# Patient Record
Sex: Male | Born: 1935 | Race: White | Hispanic: No | Marital: Married | Smoking: Former smoker
Health system: Southern US, Community
[De-identification: ages and names within clinical notes are randomized; demographics above are authoritative.]

## PROBLEM LIST (undated history)

## (undated) ENCOUNTER — Emergency Department (HOSPITAL_COMMUNITY): Discharge status: Against Medical Advice | Payer: Medicare Other

## (undated) DIAGNOSIS — E119 Type 2 diabetes mellitus without complications: Secondary | ICD-10-CM

## (undated) DIAGNOSIS — K469 Unspecified abdominal hernia without obstruction or gangrene: Secondary | ICD-10-CM

## (undated) DIAGNOSIS — N400 Enlarged prostate without lower urinary tract symptoms: Secondary | ICD-10-CM

---

## 2005-12-02 ENCOUNTER — Ambulatory Visit: Payer: Self-pay | Admitting: Family Medicine

## 2006-06-06 ENCOUNTER — Telehealth (INDEPENDENT_AMBULATORY_CARE_PROVIDER_SITE_OTHER): Payer: Self-pay | Admitting: *Deleted

## 2006-06-08 ENCOUNTER — Telehealth: Payer: Self-pay | Admitting: *Deleted

## 2006-06-08 ENCOUNTER — Telehealth (INDEPENDENT_AMBULATORY_CARE_PROVIDER_SITE_OTHER): Payer: Self-pay | Admitting: *Deleted

## 2006-06-10 ENCOUNTER — Ambulatory Visit: Payer: Self-pay | Admitting: Family Medicine

## 2006-06-10 DIAGNOSIS — E119 Type 2 diabetes mellitus without complications: Secondary | ICD-10-CM | POA: Insufficient documentation

## 2006-06-10 DIAGNOSIS — N400 Enlarged prostate without lower urinary tract symptoms: Secondary | ICD-10-CM

## 2006-06-10 LAB — CONVERTED CEMR LAB
Glucose, Urine, Semiquant: 100
Urobilinogen, UA: 0.2
WBC, UA: >20 cells/hpf
pH: 6

## 2006-11-24 ENCOUNTER — Encounter: Payer: Self-pay | Admitting: Family Medicine

## 2007-05-18 ENCOUNTER — Ambulatory Visit: Payer: Self-pay | Admitting: Family Medicine

## 2007-05-18 DIAGNOSIS — K4090 Unilateral inguinal hernia, without obstruction or gangrene, not specified as recurrent: Secondary | ICD-10-CM | POA: Insufficient documentation

## 2007-05-18 LAB — CONVERTED CEMR LAB
AST: 22 units/L (ref 0–37)
Albumin: 4.5 g/dL (ref 3.5–5.2)
BUN: 13 mg/dL (ref 6–23)
Calcium: 9.5 mg/dL (ref 8.4–10.5)
Chloride: 107 meq/L (ref 96–112)
Glucose, Bld: 138 mg/dL — ABNORMAL HIGH (ref 70–99)
Potassium: 4.8 meq/L (ref 3.5–5.3)

## 2007-05-19 ENCOUNTER — Encounter: Payer: Self-pay | Admitting: Family Medicine

## 2007-05-19 LAB — CONVERTED CEMR LAB: Hgb A1c MFr Bld: 6.7 %

## 2007-05-26 ENCOUNTER — Telehealth: Payer: Self-pay | Admitting: *Deleted

## 2008-01-11 ENCOUNTER — Telehealth (INDEPENDENT_AMBULATORY_CARE_PROVIDER_SITE_OTHER): Payer: Self-pay | Admitting: *Deleted

## 2008-01-15 ENCOUNTER — Telehealth (INDEPENDENT_AMBULATORY_CARE_PROVIDER_SITE_OTHER): Payer: Self-pay | Admitting: *Deleted

## 2008-01-18 ENCOUNTER — Ambulatory Visit: Payer: Self-pay | Admitting: Family Medicine

## 2008-01-18 LAB — CONVERTED CEMR LAB: Hgb A1c MFr Bld: 6.6 %

## 2008-10-31 ENCOUNTER — Ambulatory Visit: Payer: Self-pay | Admitting: Family Medicine

## 2008-10-31 DIAGNOSIS — M1990 Unspecified osteoarthritis, unspecified site: Secondary | ICD-10-CM | POA: Insufficient documentation

## 2009-02-25 ENCOUNTER — Telehealth: Payer: Self-pay | Admitting: Family Medicine

## 2009-08-11 ENCOUNTER — Ambulatory Visit: Payer: Self-pay | Admitting: Family Medicine

## 2009-08-11 LAB — CONVERTED CEMR LAB
Alkaline Phosphatase: 60 units/L (ref 39–117)
Creatinine, Ser: 1 mg/dL (ref 0.40–1.50)
Glucose, Bld: 144 mg/dL — ABNORMAL HIGH (ref 70–99)
HCT: 41.7 % (ref 39.0–52.0)
MCHC: 31.9 g/dL (ref 30.0–36.0)
MCV: 88.3 fL (ref 78.0–100.0)
RBC: 4.72 M/uL (ref 4.22–5.81)
Sodium: 138 meq/L (ref 135–145)
Total Bilirubin: 0.5 mg/dL (ref 0.3–1.2)
Total Protein: 6.6 g/dL (ref 6.0–8.3)

## 2009-08-12 ENCOUNTER — Encounter: Payer: Self-pay | Admitting: Family Medicine

## 2009-08-29 ENCOUNTER — Encounter: Payer: Self-pay | Admitting: Family Medicine

## 2009-10-15 ENCOUNTER — Ambulatory Visit: Payer: Self-pay | Admitting: Family Medicine

## 2009-10-15 DIAGNOSIS — R109 Unspecified abdominal pain: Secondary | ICD-10-CM | POA: Insufficient documentation

## 2009-10-15 LAB — CONVERTED CEMR LAB
ALT: 11 units/L (ref 0–53)
Bilirubin Urine: NEGATIVE
Blood in Urine, dipstick: NEGATIVE
CO2: 25 meq/L (ref 19–32)
Calcium: 9.1 mg/dL (ref 8.4–10.5)
Chloride: 102 meq/L (ref 96–112)
Creatinine, Ser: 1.05 mg/dL (ref 0.40–1.50)
Glucose, Bld: 128 mg/dL — ABNORMAL HIGH (ref 70–99)
HCT: 50.5 % (ref 39.0–52.0)
Lipase: 24 units/L (ref 0–75)
MCV: 107 fL — ABNORMAL HIGH (ref 78.0–100.0)
RBC: 4.72 M/uL (ref 4.22–5.81)
Sodium: 139 meq/L (ref 135–145)
Total Bilirubin: 1 mg/dL (ref 0.3–1.2)
Total Protein: 6.8 g/dL (ref 6.0–8.3)
Urobilinogen, UA: 0.2
WBC: 5.5 10*3/uL (ref 4.0–10.5)

## 2009-10-28 ENCOUNTER — Telehealth: Payer: Self-pay | Admitting: Family Medicine

## 2009-11-05 ENCOUNTER — Ambulatory Visit: Payer: Self-pay | Admitting: Family Medicine

## 2009-11-05 DIAGNOSIS — H547 Unspecified visual loss: Secondary | ICD-10-CM

## 2010-01-28 ENCOUNTER — Encounter: Payer: Self-pay | Admitting: *Deleted

## 2010-04-28 NOTE — Assessment & Plan Note (Signed)
Summary: f/up,tcb   Vital Signs:  Patient profile:   77 year old male Weight:      149.4 pounds Temp:     98.0 degrees F Pulse rate:   82 / minute BP sitting:   119 / 77  (left arm) Cuff size:   regular  Vitals Entered By: Garen Grams LPN (October 15, 2009 8:32 AM) CC: pains in stomach Is Patient Diabetic? Yes Did you bring your meter with you today? No Pain Assessment Patient in pain? yes     Location: stomach   CC:  pains in stomach.  History of Present Illness: ABDOMINAL PAIN Location: both flanks R > L Onset:has a written note detailing started afer he made chicken dish on July 12. Severity:  can be bad when he bends or lies down made better with walking and when he had a bowel movements afer taking MOM.  He was constipated after onset of pain.  His appetite has been decreased also.  The severity of the pain has lessened since yesterday  Symptoms Nausea/Vomiting:N Diarrhea:N Melena/BRBPR:N Hematemesis:N Fever/Chills:N Dysuria:N Back pain:N Rash:N Wt loss:N  ROS - as above PMH - Medications reviewed and updated in medication list.  Smoking Status noted in VS form     Habits & Providers  Alcohol-Tobacco-Diet     Tobacco Status: never  Current Medications (verified): 1)  Terazosin Hcl 5 Mg  Caps (Terazosin Hcl) .Marland Kitchen.. 1at Bedtime. 2)  Accu-Chek Comfort Curve  Strp (Glucose Blood)  Allergies: No Known Drug Allergies  Social History: Smoking Status:  never  Physical Exam  General:  Well-developed,well-nourished,in no acute distress; alert,appropriate and cooperative throughout examination.  His usual joking self with me and nurses- brought in a written joke Neck:  No deformities, masses, or tenderness noted. Lungs:  Normal respiratory effort, chest expands symmetrically. Lungs are clear to auscultation, no crackles or wheezes. Heart:  Normal rate and regular rhythm. S1 and S2 normal without gallop, murmur, click, rub or other extra sounds. Abdomen:   mildly distended with increased bowel sounds.  Mildly tender over R and L UQ and flank areas.  No gaurding or rebound No masses   Femoral pulses equal and strong bilaterally Msk:  Tender over L and R paraspinous muscles and flank area.  Pain worse with sitting up and bending  Extremities:  No clubbing, cyanosis, edema, or deformity noted with normal full range of motion of all joints.   Skin:  No rashes  Cervical Nodes:  No lymphadenopathy noted Inguinal Nodes:  No significant adenopathy   Impression & Recommendations:  Problem # 1:  ABDOMINAL PAIN (ICD-789.00) Seems most consistent with constipation and distension although could be musculoskeletal with pain over flanks. Renal infection very unlikely given normal urinalysis.  No red flags for infection or obstruction or aneurysm.  Will check labs and follow closely.   He had not called his daughter and I strongly suggeste that he should mention it to her.  See instructions.  If worsens or persists or labs are concerning will likely need imaging   Orders: Comp Met-[facility] [phone]) CBC-[facility] (09811) Urinalysis-[facility] (00000) Lipase-[facility] ([id number]) [facility]- Est  Level 4 ([address])  Complete Medication List: 1)  Terazosin Hcl 5 Mg Caps (Terazosin hcl) .Marland Kitchen.. 1at bedtime. 2)  Accu-chek Comfort Curve Strp (Glucose blood)  Patient Instructions: 1)  If the stomach pain is not better in 2 days come back (Friday) 2)  If not gone in one week then come back 3)  If any bleeding or fever or  worsening pain you should calll [place] immediately 4)  If no bowel movement by tomorrow take one pack of Miralax 5)   (over the counter)  6)  I will call you if your lab is abnormal otherwise I will send you a letter within 2 weeks.  Laboratory Results   Urine Tests  Date/Time Received: October 15, 2009 8:50 AM  Date/Time Reported: October 15, 2009 9:26 AM   Routine Urinalysis   Color: yellow Appearance: Clear Glucose: negative   (Normal Range: Negative) Bilirubin:  negative   (Normal Range: Negative) Ketone: small (15)   (Normal Range: Negative) Spec. Gravity: 1.015   (Normal Range: 1.003-1.035) Blood: negative   (Normal Range: Negative) pH: 7.5   (Normal Range: 5.0-8.0) Protein: trace   (Normal Range: Negative) Urobilinogen: 0.2   (Normal Range: 0-1) Nitrite: negative   (Normal Range: Negative) Leukocyte Esterace: trace   (Normal Range: Negative)  Urine Microscopic WBC/HPF: 1-5 RBC/HPF: 0-3 Bacteria/HPF: 1+ Mucous/HPF: 2+ Epithelial/HPF: occ Casts/LPF: 1-5 hyaline    Comments: ...............test performed by......Marland KitchenBonnie A. [place], MLS (ASCP)cm

## 2010-04-28 NOTE — Assessment & Plan Note (Signed)
Summary: f/u,df   Vital Signs:  Patient profile:   77 year old male Weight:      153.6 pounds Temp:     98.7 degrees F oral Pulse rate:   71 / minute BP sitting:   125 / 78  (left arm) Cuff size:   regular  Vitals Entered By: Garen Grams LPN (Aug 11, 2009 3:46 PM) CC: f/u dm Is Patient Diabetic? Yes Did you bring your meter with you today? No Pain Assessment Patient in pain? no        CC:  f/u dm.  History of Present Illness: Feels generally well.  Walking 1 mi per day when not too hot.  DIABETES Disease Monitoring Blood Sugar ranges: checks fasting once a month all less 150   Polyuria:no more than usual three times a day  night time    Visual problems:N  Medications Compliance:diet controlled   Hypoglycemic symptoms:N  Aches some in neck aftering getting hugged.  Better since using heat.  No weakness or loss of range of motion  BPH doing well with terazosin. Urinates 2-3 times per night with fairly normal stream.  No pain or urgency or bleeding   ROS - as above PMH - Medications reviewed and updated in medication list.  Smoking Status noted in VS form       Habits & Providers  Alcohol-Tobacco-Diet     Tobacco Status: quit > 6 months  Current Medications (verified): 1)  Terazosin Hcl 5 Mg  Caps (Terazosin Hcl) .Marland Kitchen.. 1at Bedtime. 2)  Accu-Chek Comfort Curve  Strp (Glucose Blood)  Allergies: No Known Drug Allergies  Physical Exam  General:  Well-developed,well-nourished,in no acute distress; alert,appropriate and cooperative throughout examination,  Hard of hearing but knows all his medications and routines  Head:  Normocephalic and atraumatic without obvious abnormalities. No apparent alopecia or balding. Eyes:  R eye cloudy old injury  Ears:  External ear exam shows no significant lesions or deformities.  Otoscopic examination reveals clear canals, tympanic membranes are intact bilaterally without bulging, retraction, inflammation or discharge.  Hearing is grossly normal bilaterally. Mouth:  Oral mucosa and oropharynx without lesions or exudates.  Teeth in good repair. Neck:  No deformities, masses, or tenderness noted. Lungs:  Normal respiratory effort, chest expands symmetrically. Lungs are clear to auscultation, no crackles or wheezes. Heart:  Normal rate and regular rhythm. S1 and S2 normal without gallop, murmur, click, rub or other extra sounds. Abdomen:  Bowel sounds positive,abdomen soft and non-tender without masses, organomegaly or hernias noted. Genitalia:  Testes bilaterally descended without nodularity, tenderness or masses. No scrotal masses or lesions. No penis lesions or urethral discharge.   Small hernias bilaterally nontender  Msk:  No deformity or scoliosis noted of thoracic or lumbar spine.   Extremities:  No clubbing, cyanosis, edema, or deformity noted with normal full range of motion of all joints.   Small abrasion 2mm on L great toe bandaged and healing well.  Trimmed long toenal in R small toe Skin:  Intact without suspicious lesions or rashes Cervical Nodes:  No lymphadenopathy noted  Diabetes Management Exam:    Foot Exam (with socks and/or shoes not present):       Sensory-Pinprick/Light touch:          Left medial foot (L-4): normal          Left dorsal foot (L-5): normal          Left lateral foot (S-1): normal  Right medial foot (L-4): normal          Right dorsal foot (L-5): normal          Right lateral foot (S-1): normal       Sensory-Monofilament:          Left foot: normal          Right foot: normal       Inspection:          Left foot: normal          Right foot: normal       Nails:          Left foot: normal          Right foot: normal   Impression & Recommendations:  Problem # 1:  DIABETES MELLITUS, TYPE II (ICD-250.00) Good control  Orders: A1C-[facility] ([address]) Comp Met-[facility] (60454-09811) CBC-[facility] (91478) [facility]- Est  Level 4 (29562)  Labs Reviewed: Creat: 1.04 (05/18/2007)     Reviewed HgBA1c results: 6.4 (08/11/2009)  6.6 (10/31/2008)  Problem # 2:  BENIGN PROSTATIC HYPERTROPHY (ICD-600.00)  Well controlled with terazosin   Orders: [facility]- Est  Level 4 (13086)  Problem # 3:  DEGENERATIVE JOINT DISEASE (ICD-715.90)  mild exacerbation in cervical spine without red flags.  Continue his exercise program and using heat.    Orders: [facility]- Est  Level 4 ([address]846)  Complete Medication List: 1)  Terazosin Hcl 5 Mg Caps (Terazosin hcl) .Marland Kitchen.. 1at bedtime. 2)  Accu-chek Comfort Curve Strp (Glucose blood)  Patient Instructions: 1)  Please schedule a follow-up appointment in 1 year.  2)  I will call you if your lab is abnormal otherwise I will send you a letter within 2 weeks. 3)  File your nails every second day to keep them short 4)  Your A1c was 6.4 which means great control 5)  See Dr Hazle Quant for your eye check 6)  Call me with any questions or problem  Laboratory Results   Blood Tests   Date/Time Received: Aug 11, 2009 3:41 PM  Date/Time Reported: Aug 11, 2009 4:06 PM   HGBA1C: 6.4%   (Normal Range: Non-Diabetic - 3-6%   Control Diabetic - 6-8%)  Comments: ...............test performed by.................Marland KitchenGaren Grams, LPN .............entered by...........Marland KitchenBonnie A. [place], MLS (ASCP)cm

## 2010-04-28 NOTE — Consult Note (Signed)
Summary: [facility]   Imported By: Clydell Hakim 09/11/2009 12:05:25  _____________________________________________________________________  External Attachment:    Type:   Image     Comment:   External Document

## 2010-04-28 NOTE — Assessment & Plan Note (Signed)
Summary: abd pain/[place]   Vital Signs:  Patient profile:   77 year old male Weight:      148.8 pounds Temp:     98.4 degrees F oral Pulse rate:   72 / minute Pulse rhythm:   regular BP sitting:   123 / 77  (right arm) Cuff size:   regular  Vitals Entered By: Loralee Pacas CMA (November 05, 2009 9:26 AM) CC: abdominal pain   CC:  abdominal pain.  History of Present Illness: Abdominal Pain steadily improving but still mild biltat anter flank pain when first gets up in the AM.  None throughout the day or when moves around.  Still walking everyday for exercise. bowel movements are regular without blood.  No nausea or vomiting or diarrhea.  Eating well with lots of fruit.  No fevers or chills or fatigue  ROS - as above PMH - Medications reviewed and updated in medication list.  Smoking Status noted in VS form    -  Date:  08/27/2009    Diabetes Eye Exam [place]   Current Medications (verified): 1)  Terazosin Hcl 5 Mg  Caps (Terazosin Hcl) .Marland Kitchen.. 1at Bedtime. 2)  Accu-Chek Comfort Curve  Strp (Glucose Blood)  Allergies: No Known Drug Allergies  Physical Exam  General:  Well-developed,well-nourished,in no acute distress; alert,appropriate and cooperative throughout examination Lungs:  Normal respiratory effort, chest expands symmetrically. Lungs are clear to auscultation, no crackles or wheezes. Heart:  Normal rate and regular rhythm. S1 and S2 normal without gallop, murmur, click, rub or other extra sounds. Abdomen:  Bowel sounds positive,abdomen softr without masses, organomegaly or hernias noted. Mild bilateral mid lateral tenderness with deep palpation no massess no gaurding rebound Femoral pulses equal bilaterally     Impression & Recommendations:  Problem # 1:  ABDOMINAL PAIN (ICD-789.00)  still present but improved.  No red flags on history or exam or recent blood tests.  Since improving will continue to monitor but no further testing unless persists or  worsens  Orders: [facility]- Est Level  3 (99213)  Complete Medication List: 1)  Terazosin Hcl 5 Mg Caps (Terazosin hcl) .Marland Kitchen.. 1at bedtime. 2)  Accu-chek Comfort Curve Strp (Glucose blood)  Patient Instructions: 1)  Come back in November for diabetes check 2)  Call me if your abdominal pain is not steadily improving  3)  Consider the Zostavax vaccine  Prevention & Chronic Care Immunizations   Influenza vaccine: Not documented    Tetanus booster: Not documented    Pneumococcal vaccine: Not documented    H. zoster vaccine: Not documented  Colorectal Screening   Hemoccult: Not documented   Hemoccult due: Not Indicated    Colonoscopy: In [place] neg by report  (05/27/2004)   Colonoscopy due: 05/28/2014  Other Screening   PSA: 6.44  (05/18/2007)   PSA due due: 11/2006   Smoking status: never  (10/15/2009)  Diabetes Mellitus   HgbA1C: 6.4  (08/11/2009)    Eye exam: [place]   (08/27/2009)    Foot exam: yes  (08/11/2009)   High risk foot: Not documented   Foot care education: Not documented    Urine microalbumin/creatinine ratio: Not documented  Lipids   Total Cholesterol: Not documented   LDL: Not documented   LDL Direct: Not documented   HDL: Not documented   Triglycerides: Not documented  Self-Management Support :    Diabetes self-management support: Not documented

## 2010-04-28 NOTE — Progress Notes (Signed)
Summary: triage  Phone Note Call from Patient Call back at Home Phone [phone]   Caller: Patient Summary of Call: Pt checking on his blood work and wondering about if the condition he has is life threating. Initial call taken by: Clydell Hakim,  October 28, 2009 9:25 AM  Follow-up for Phone Call        line remains busy Follow-up by: Golden Circle RN,  October 28, 2009 9:28 AM  Additional Follow-up for Phone Call Additional follow up Details #1::        told him labs were ok. glucose a little up but he has DM. he is still having abd pain off & on. worse when he lays down to put drops in his eyes. he is concerned & wants to speak with pcp. appt made next wednesday. told him per notes it did not appear life threatening. declined to see any other md. told him to call back if worse Additional Follow-up by: Golden Circle RN,  October 28, 2009 9:31 AM

## 2010-04-28 NOTE — Letter (Signed)
Summary: Generic Letter  [facility]  [address]   [place], [place] [address]   Phone: [phone]  Fax: [phone]    08/12/2009  Alex Pearson [address] [place], [place]  [address]  Dear Mr. Newey,  I'm happy to say all your blood tests are normal.  Hope your neck will get better quickly and keep up the walking!    Sincerely,   Pearlean Brownie MD  Appended Document: Generic Letter mailed.

## 2010-04-28 NOTE — Miscellaneous (Signed)
Summary:  received flu vaccine  Clinical Lists Changes  received notification from [facility] that patient recieved  flu  vaccine 01/19/2010. Theresia Lo RN  January 28, 2010 4:16 PM  Observations: Added new observation of FLU VAX: Historical (01/19/2010 16:16)      Influenza Immunization History:    Influenza # 1:  Historical (01/19/2010)

## 2010-04-30 ENCOUNTER — Encounter: Payer: Self-pay | Admitting: *Deleted

## 2010-12-02 ENCOUNTER — Encounter: Payer: Self-pay | Admitting: Family Medicine

## 2010-12-02 ENCOUNTER — Ambulatory Visit (INDEPENDENT_AMBULATORY_CARE_PROVIDER_SITE_OTHER): Payer: Medicare Other | Admitting: Family Medicine

## 2010-12-02 VITALS — BP 121/85 | HR 67 | Temp 97.2°F | Ht 66.0 in | Wt 149.0 lb

## 2010-12-02 DIAGNOSIS — E119 Type 2 diabetes mellitus without complications: Secondary | ICD-10-CM

## 2010-12-02 DIAGNOSIS — Z0000 Encounter for general adult medical examination without abnormal findings: Secondary | ICD-10-CM

## 2010-12-02 LAB — POCT GLYCOSYLATED HEMOGLOBIN (HGB A1C): Hemoglobin A1C: 6.3

## 2010-12-02 NOTE — Progress Notes (Signed)
Patient here for annual wellness visit, patient reports: Risk Factors/Conditions needing evaluation or treatment: Pt does not have any risk factors that need evaluation. Home Safety: Pt lives in retirement community and reports having smoke detectors and adaptive equipment in bathroom.  Other Information: Corrective lens: Pt wears daily corrective lens and visits eye doctor every 6 months. Dentures: Pt does not have dentures and visits dentist as needed. Memory: Pt reports some memory problems. Patient's Mini Mental Score (recorded in doc. flowsheet): 30  Balance/Gait: Pt walks 2-3 miles a day without problems. Balance Abnormal Patient value  Sitting balance    Sit to stand    Attempts to arise    Immediate standing balance    Standing balance    Nudge    Eyes closed- [place]    Tandem stance x Unable to do  Back lean    Neck Rotation    360 degree turn    Sitting down     Gait Abnormal Patient value  Initiation of gait    Step length-left    Step length-right    Step height-left    Step height-right    Step symmetry    Step continuity    Path deviation    Trunk movement    Walking stance        Annual Wellness Visit Requirements Recorded Today In  Medical, family, social history Past Medical, Family, Social History Section  Current providers Care team  Current medications Medications  Wt, BP, Ht, BMI Vital signs  Hearing assessment (welcome visit) Hearing/vision  Tobacco, alcohol, illicit drug use History  ADL Nurse Assessment  Depression Screening Nurse Assessment  Cognitive impairment Nurse Assessment  Mini Mental Status Document Flowsheet  Fall Risk Nurse Assessment  Home Safety Progress Note  End of Life Planning (welcome visit) Social Documentation  Medicare preventative services Progress Note  Risk factors/conditions needing evaluation/treatment Progress Note  Personalized health advice Patient Instructions, goals, letter  Diet & Exercise Social  Documentation  Emergency Contact Social Documentation  Seat Belts Social Documentation  Sun exposure/protection Social Documentation    Medicare Prevention Plan:   Recommended Medicare Prevention Screenings Men over 65 Test For Frequency Date of Last- BOLD if needed  Colorectal Cancer 1-10 yrs 2006  Prostate Cancer Never or yearly 2009  Aortic Aneurysm Once if 65-75 with hx of smoking   Cholesterol 5 yrs   Diabetes yearly 9/12  HIV yearly   Influenza Shot yearly 2011  Pneumonia Shot once   Zostavax Shot once declined   Reviewed and agree with above  Heart - Regular rate and rhythm.  No murmurs, gallops or rubs.    Lungs:  Normal respiratory effort, chest expands symmetrically. Lungs are clear to auscultation, no crackles or wheezes. Extremities:  No cyanosis, edema, or deformity noted with good range of motion of all major joints.    Alex Pearson,Alex Pearson

## 2010-12-02 NOTE — Patient Instructions (Signed)
1. Continue to walk 2-3 miles a day. 2. Focus on eating vegetables 3-4 times a day. 3. Consider completing living will and mailing a copy to Dr. Deirdre Priest. 4. Remember to schedule a cleaning with your dentist.  5. If you are concerned about your hearing follow up with Dr. Marciano Sequin at  [address] [place] [address] [phone] phone [phone] fax Hours: 8:30 a.m. - 5:00 p.m., Monday - Friday

## 2010-12-03 ENCOUNTER — Encounter: Payer: Self-pay | Admitting: Family Medicine

## 2010-12-28 ENCOUNTER — Telehealth: Payer: Self-pay | Admitting: *Deleted

## 2010-12-28 ENCOUNTER — Other Ambulatory Visit: Payer: Self-pay | Admitting: Family Medicine

## 2010-12-28 NOTE — Telephone Encounter (Signed)
Refill request

## 2010-12-28 NOTE — Telephone Encounter (Signed)
[facility] calling requesting a RX for Accuchek Comfort Curve test strips.  Rx needs to have specific directions and a diagnosis code. Ileana Ladd

## 2010-12-29 MED ORDER — GLUCOSE BLOOD VI STRP: ORAL_STRIP | Status: DC

## 2011-01-20 ENCOUNTER — Ambulatory Visit: Payer: Medicare Other | Admitting: Sports Medicine

## 2011-04-01 ENCOUNTER — Ambulatory Visit (INDEPENDENT_AMBULATORY_CARE_PROVIDER_SITE_OTHER): Payer: Medicare Other | Admitting: Family Medicine

## 2011-04-01 ENCOUNTER — Encounter: Payer: Self-pay | Admitting: Family Medicine

## 2011-04-01 ENCOUNTER — Telehealth: Payer: Self-pay | Admitting: Family Medicine

## 2011-04-01 VITALS — BP 142/78 | HR 70 | Temp 98.1°F | Ht 66.0 in | Wt 153.0 lb

## 2011-04-01 DIAGNOSIS — N400 Enlarged prostate without lower urinary tract symptoms: Secondary | ICD-10-CM

## 2011-04-01 DIAGNOSIS — J069 Acute upper respiratory infection, unspecified: Secondary | ICD-10-CM

## 2011-04-01 LAB — POCT URINALYSIS DIPSTICK
Blood, UA: NEGATIVE
Glucose, UA: NEGATIVE
Nitrite, UA: NEGATIVE
Protein, UA: NEGATIVE
Spec Grav, UA: 1.015
Urobilinogen, UA: 0.2

## 2011-04-01 NOTE — Assessment & Plan Note (Signed)
Patient to return to Dr. Deirdre Priest is for evaluation. Check UA today to make sure no infection.

## 2011-04-01 NOTE — Telephone Encounter (Signed)
Patient states he developed cold symptoms 10 days ago, dry cough, runny nose, sneezing.  These symptoms have improved somewhat but  still very fatigued. He usually walks 3 miles a day but cannot do this now. Just cannot get back to usual activities. Still has nasal drainage,  which is clear. Wants Rx called to pharmacy . Advised that he will need to be seen  to evaluate before this can be done. appoinmtent scheduled for today.

## 2011-04-01 NOTE — Telephone Encounter (Signed)
Mr. Maniaci would like to speak to a nurse about what he can take for a very long lasting cold.

## 2011-04-01 NOTE — Patient Instructions (Signed)
Please look for afrin nasal spray to help with your congestion Only use for 3 days You can take mucinex for the cough   Please come back and see Dr. Deirdre Priest for your urinary symptoms

## 2011-04-01 NOTE — Progress Notes (Signed)
  Subjective:    Patient ID: Alex Pearson, male    DOB: 1917/06/09, 78 y.o.   MRN: [id number]  HPI  URI symptom-patient with cough, nasal congestion, fatigue x10 days. He denies any fever during that time. His cough is productive at night and dry during the day. Feels that his cough is keeping him up at night.  Urinary frequency-patient notes that he has been peeing 4 times per night which is up from his usual 2-3. He is still taking his terazosin. He denies any pain with urination or daytime frequency.  Review of Systems See above    Objective:   Physical Exam  Vital signs reviewed General appearance - alert, well appearing, and in no distress and oriented to person, place, and time Heart - normal rate, regular rhythm, normal S1, S2, no murmurs, rubs, clicks or gallops Chest - clear to auscultation, no wheezes, rales or rhonchi, symmetric air entry, no tachypnea, retractions or cyanosis Ears - bilateral TM's and external ear canals normal, right ear normal, left ear normal Nose - a thin clear discharge present Throat-some erythema present, no discharge on tonsils        Assessment & Plan:

## 2011-04-01 NOTE — Assessment & Plan Note (Signed)
Advice 3 days of Afrin and Mucinex for cough.

## 2011-05-10 ENCOUNTER — Encounter: Payer: Self-pay | Admitting: Family Medicine

## 2011-05-10 ENCOUNTER — Ambulatory Visit (INDEPENDENT_AMBULATORY_CARE_PROVIDER_SITE_OTHER): Payer: Medicare Other | Admitting: Family Medicine

## 2011-05-10 VITALS — BP 136/77 | HR 64 | Temp 98.6°F | Ht 66.0 in | Wt 152.0 lb

## 2011-05-10 DIAGNOSIS — N400 Enlarged prostate without lower urinary tract symptoms: Secondary | ICD-10-CM

## 2011-05-10 DIAGNOSIS — E119 Type 2 diabetes mellitus without complications: Secondary | ICD-10-CM

## 2011-05-10 MED ORDER — TAMSULOSIN HCL 0.4 MG PO CAPS: 0.4000 mg | ORAL_CAPSULE | Freq: Every day | ORAL | Status: DC

## 2011-05-10 NOTE — Assessment & Plan Note (Signed)
Worsening.  Do not feel is safe to increase terazosin with his current blood pressure so will change to flomax and increase as needed.   No signs of prostate cancer and will not investigate if flomax works.

## 2011-05-10 NOTE — Progress Notes (Signed)
  Subjective:    Patient ID: Alex Pearson, male    DOB: 1917-10-26, 78 y.o.   MRN: [id number]  HPI  BPH Having increased frequency of urination.  Has to sometimes manipulate his abdomen to increase his flow.  He has been on terazosin for years.   No dysuria or hematuria or bone pain or lightheadness.  His blood pressure is always well controlled    DIABETES Disease Monitoring: Blood Sugar ranges-always less than 150 Polyuria/phagia/dipsia- no      Visual problems- none new  Medications: Compliance- watching his diet and walking Hypoglycemic symptoms- no  Review of Symptoms - see HPI  PMH - Smoking status noted.     Review of Systems     Objective:   Physical Exam  Alert no acute distress Heart - Regular rate and rhythm.  No murmurs, gallops or rubs.    Lungs:  Normal respiratory effort, chest expands symmetrically. Lungs are clear to auscultation, no crackles or wheezes. Diabetic Foot Check -  Appearance - no lesions, ulcers or calluses Skin - no unusual pallor or redness but is dry Sensation - grossly intact to light touch and intact to monofilament       Assessment & Plan:

## 2011-05-10 NOTE — Assessment & Plan Note (Signed)
Well controlled 

## 2011-05-10 NOTE — Patient Instructions (Addendum)
Stop your terazosin  The next day start tamsulosin one tablet every day before bed   Come back and see me in 2-3 weeks to see how it is working.  Call if any problems  I will call you if your lab tests are not normal.  Otherwise we will discuss them at your next visit.  Ask your daughter if she will be your [facility] - Health Care Power of Attorney  Use Vaseline Lotion every night on your feet

## 2011-05-11 LAB — COMPREHENSIVE METABOLIC PANEL
Alkaline Phosphatase: 59 U/L (ref 39–117)
BUN: 13 mg/dL (ref 6–23)
CO2: 25 mEq/L (ref 19–32)
Creat: 0.98 mg/dL (ref 0.50–1.35)
Glucose, Bld: 92 mg/dL (ref 70–99)
Sodium: 137 mEq/L (ref 135–145)
Total Bilirubin: 0.6 mg/dL (ref 0.3–1.2)

## 2011-05-11 LAB — PSA: PSA: 32.52 ng/mL — ABNORMAL HIGH (ref <=4.00)

## 2011-05-14 ENCOUNTER — Other Ambulatory Visit: Payer: Self-pay | Admitting: Family Medicine

## 2011-05-14 ENCOUNTER — Telehealth: Payer: Self-pay | Admitting: Family Medicine

## 2011-05-14 NOTE — Telephone Encounter (Signed)
Spoke with him regarding resonse to flomax.  Has taken only one dose and and does not feel bad no lightheadness but has not noticed any better urination. Informed him about his elevated PSA and suggested perhaps his daughter should come to his next visit but she travels with work.  He requests we discuss it next visit and then send his daughter a note He will call if not feeling well

## 2011-05-24 ENCOUNTER — Ambulatory Visit (INDEPENDENT_AMBULATORY_CARE_PROVIDER_SITE_OTHER): Payer: Medicare Other | Admitting: Family Medicine

## 2011-05-24 ENCOUNTER — Encounter: Payer: Self-pay | Admitting: Family Medicine

## 2011-05-24 DIAGNOSIS — N400 Enlarged prostate without lower urinary tract symptoms: Secondary | ICD-10-CM

## 2011-05-24 NOTE — Patient Instructions (Addendum)
  There is a possibility you could have prostate cancer.  If you would like to see a urologist let me know. Or if your symptoms trouble urinating get worse then we may want you to see them for treatment  We may want to consider Finasteride to help shrink the prostate.  I will send you information on it  If your daughter would like to talk with me I would be happy to   Come back 2 months to check on your symptoms

## 2011-05-24 NOTE — Progress Notes (Signed)
  Subjective:    Patient ID: Alex Pearson, male    DOB: 07-23-1917, 78 y.o.   MRN: [id number]  HPI  BPH He tried the flomax but went back to the terazosin since he felt in helped more.  Overall his symptoms are improved which he attributes to eating 6-7 slices of beets each night.  He now gets up only twice a night and can walk for an hour or so before urinating.   He is happy with this frequency.  We discussed the probability of prostate cancer.   He does not wish to pursue any treatment or further investigation at this time. He had looked up information since I had called him earlier this month.  I suggested seeing a urologist to get more information.  He will consider this but feels its likely something he will "outlive" meaning he will die before it causes him any problems.  I suggested we might want to add finasteride and I will send him information on this to review .   I offered to talk with his daughter or send he a letter.  He specifically did not want me to do that - he will discuss with her  Review of Systems     Objective:   Physical Exam   We discussed the probability of prostate cancer.   He does not wish to pursue any treatment or further investigation at this time. He had looked up information since I had called him earlier this month.  I suggested seeing a urologist to get more information.  He will consider this but feels its likely something he will "outlive" meaning he will die before it causes him any problems.  I suggested we might want to add finasteride and I will send him information on this to review .   I offered to talk with his daughter or send he a letter.  He specifically did not want me to do that - he will discuss with her     Assessment & Plan:

## 2011-05-24 NOTE — Assessment & Plan Note (Addendum)
Symptoms have now stabilized with his addition of beets.  With rising PSA he has a significant chance of prostate cancer.   He does not wish any further investigation now.  He does not wish me to contact his daughter.  I will send information on finasteride.   The best course might be to use terazosin for current symptoms and finasteride for obstruction and perhaps to slow down prostate cancer if he has this

## 2011-05-25 ENCOUNTER — Encounter: Payer: Self-pay | Admitting: Family Medicine

## 2011-07-29 ENCOUNTER — Other Ambulatory Visit: Payer: Self-pay | Admitting: Family Medicine

## 2011-07-29 NOTE — Telephone Encounter (Signed)
Mr. Alex Pearson called to say that his prostate situation is not getting better and would like to have the rx Proscar sent to [facility] on [address]  Call him to let him know when it has been sent.  Leave msg if he doesn't answer.

## 2011-07-30 MED ORDER — FINASTERIDE 5 MG PO TABS: 5.0000 mg | ORAL_TABLET | Freq: Every day | ORAL | Status: DC

## 2011-07-30 NOTE — Telephone Encounter (Signed)
Called x 2 and left voicemail Told him I would call in Rx but for him to call next week to discuss

## 2011-08-03 NOTE — Telephone Encounter (Signed)
Left voice mail

## 2011-08-19 ENCOUNTER — Ambulatory Visit: Payer: Medicare Other | Admitting: *Deleted

## 2011-08-19 ENCOUNTER — Encounter: Payer: Self-pay | Admitting: Family Medicine

## 2011-08-19 ENCOUNTER — Other Ambulatory Visit: Payer: Self-pay | Admitting: Family Medicine

## 2011-08-19 DIAGNOSIS — N400 Enlarged prostate without lower urinary tract symptoms: Secondary | ICD-10-CM

## 2011-08-19 NOTE — Progress Notes (Signed)
Patient ID: Alex Pearson, male   DOB: [birth year], 78 y.o.   MRN: [id number]  Alex Pearson stopped by to get his glucometer and we talked briefly.  He is taking the tamulosin one at night has stopped hytrin and has not started or picked up his finasteride.   He is urinating "all right".   Still gets up multiple times at night and occasionally has to stop while walkng to urinate.  No pain   He again refuses to see a urologist just wants to treat his symptoms.  Asked him to take both medication and gave him a list with details  Asked him to see me in 1 month

## 2011-08-19 NOTE — Progress Notes (Signed)
Patient in office with his glucose meter [employer] Accu-Chek. He is unable to get it to work. RN also tried and unable to turn it on. He states the meter is a year old and the battery is a year old. Advised patient the only thing I can suggest is to try a new battery.  If that doesn't work  probably will need to get a new meter.  He has two vials of test strips so will try to get the same type of meter.

## 2011-09-15 ENCOUNTER — Ambulatory Visit (INDEPENDENT_AMBULATORY_CARE_PROVIDER_SITE_OTHER): Payer: Medicare Other | Admitting: Family Medicine

## 2011-09-15 VITALS — BP 158/76 | HR 71 | Temp 98.3°F | Ht 66.0 in | Wt 146.5 lb

## 2011-09-15 DIAGNOSIS — N400 Enlarged prostate without lower urinary tract symptoms: Secondary | ICD-10-CM

## 2011-09-15 NOTE — Progress Notes (Signed)
  Subjective:    Patient ID: Alex Pearson, male    DOB: [birth year], 78 y.o.   MRN: [id number]  HPI  BPH Having to get ups multiple times per night and has to milk bladder to get all urine out.   No pain or dysuria.  Taking one flomax in PM and finasteride in AM.  No side effects noticed and no lightheadness or chest pain  Review of Symptoms - see HPI  PMH - Smoking status noted.     Review of Systems     Objective:   Physical Exam  Active getting up from chair without lightheadness or symptoms       Assessment & Plan:

## 2011-09-15 NOTE — Patient Instructions (Addendum)
Take 2 tamulosin every night.  Get up slowly when you go to the bathroom.  Call if you have any lightheadness  Continue to take one Proscar in the AM  Call me in one month to report your symptoms   Come back in 3 months - September

## 2011-09-15 NOTE — Assessment & Plan Note (Signed)
Not improving.  Will increase flomax and continue finasteride at current dose since has been taking for only a month.  No symptoms of lightheadness but cautioned him to get up slowly. He relates he discussed the likelihood of having prostate cancer with his daughter and his desire not to pursue treatment

## 2011-09-16 ENCOUNTER — Other Ambulatory Visit: Payer: Self-pay | Admitting: Family Medicine

## 2011-09-16 DIAGNOSIS — N400 Enlarged prostate without lower urinary tract symptoms: Secondary | ICD-10-CM

## 2011-09-16 MED ORDER — TAMSULOSIN HCL 0.4 MG PO CAPS: 0.8000 mg | ORAL_CAPSULE | Freq: Every day | ORAL | Status: AC

## 2011-10-13 ENCOUNTER — Other Ambulatory Visit: Payer: Self-pay | Admitting: Family Medicine

## 2011-10-13 MED ORDER — FINASTERIDE 5 MG PO TABS: 5.0000 mg | ORAL_TABLET | Freq: Every day | ORAL | Status: AC

## 2011-10-15 ENCOUNTER — Encounter: Payer: Self-pay | Admitting: Family Medicine

## 2011-10-15 ENCOUNTER — Telehealth: Payer: Self-pay | Admitting: Family Medicine

## 2011-10-15 NOTE — Telephone Encounter (Signed)
Sent letter

## 2011-10-15 NOTE — Telephone Encounter (Signed)
Patient is calling to let Dr. Deirdre Priest know that the Tamsulosin is working perfectly.  Instead of getting up 5 times per night, he has been getting up 3 times per night and last night was the miracle night because he only got up twice.  He wants to know if he needs to continue the current dosage since it is working so well.  He is also asking for that information to be sent to him in the form of a letter because he is so hard of hearing he wants to be able to see the response so he doesn't misunderstand.

## 2011-10-19 ENCOUNTER — Telehealth: Payer: Self-pay | Admitting: Family Medicine

## 2011-10-19 NOTE — Telephone Encounter (Signed)
Tell him to continue to take 2 tabs daily Thanks  LC

## 2011-10-19 NOTE — Telephone Encounter (Signed)
Patient is taking 2 tablets of the Tamsulosin.  It seems to be working fine and he wants to know if he should continue to take 2 per day or go down to 1.

## 2011-10-19 NOTE — Telephone Encounter (Signed)
Pt informed and agreeable. Thorin Starner Dawn  

## 2011-12-13 ENCOUNTER — Encounter: Payer: Self-pay | Admitting: Home Health Services

## 2012-01-03 ENCOUNTER — Encounter: Payer: Self-pay | Admitting: Family Medicine

## 2012-01-03 ENCOUNTER — Ambulatory Visit (INDEPENDENT_AMBULATORY_CARE_PROVIDER_SITE_OTHER): Payer: Medicare Other | Admitting: Family Medicine

## 2012-01-03 VITALS — BP 163/78 | HR 67 | Temp 98.1°F | Ht 66.0 in | Wt 151.0 lb

## 2012-01-03 DIAGNOSIS — E119 Type 2 diabetes mellitus without complications: Secondary | ICD-10-CM

## 2012-01-03 DIAGNOSIS — Z23 Encounter for immunization: Secondary | ICD-10-CM

## 2012-01-03 DIAGNOSIS — N400 Enlarged prostate without lower urinary tract symptoms: Secondary | ICD-10-CM

## 2012-01-03 NOTE — Patient Instructions (Addendum)
Come back in Feb 2014 to check your diabetes  Check with insurance to see if you qualify for the Zostavax (Shingles) Shot Call [facility].  Let me know what they say

## 2012-01-03 NOTE — Assessment & Plan Note (Signed)
Improved symptoms on increased therapy.  He does not want investigation for cancer.  He is using daily imagery to keep it in check.   At his age I agree that further investigation is unlikely to be beneficial

## 2012-01-03 NOTE — Assessment & Plan Note (Signed)
Well controlled with diet.  Will check A1c yearly unless symptoms develop

## 2012-01-03 NOTE — Progress Notes (Signed)
  Subjective:    Patient ID: Alex Pearson, male    DOB: 03-21-1918, 78 y.o.   MRN: [id number]  HPI BPH Improved Getting up 2-3 x per night.   Able to walk about 50 minutes before needs to urinate.  No pain or dysuria.  Taking two flomax in PM and finasteride in AM.  No side effects noticed and no lightheadness or chest pain  Review of Symptoms - see HPI  PMH - Smoking status noted.     Review of Systems     Objective:   Physical Exam Alert telling jokes Heart - Regular rate and rhythm.  No murmurs, gallops or rubs.    Lungs:  Normal respiratory effort, chest expands symmetrically. Lungs are clear to auscultation, no crackles or wheezes. Extremities:  No cyanosis, edema, or deformity noted with good range of motion of all major joints.          Assessment & Plan:

## 2012-01-06 ENCOUNTER — Telehealth: Payer: Self-pay | Admitting: Family Medicine

## 2012-01-06 NOTE — Telephone Encounter (Signed)
Left message on patient voicemail that the cost for zostavax out of pocket is somewhere around $265. Told him to call back if any questions.

## 2012-01-06 NOTE — Telephone Encounter (Signed)
Patient called his insurance to see if he is covered for Zostavax, but he is not and wants to know what the cost out of pocket would be.  It is ok to leave a message on his answering machine.

## 2012-01-19 ENCOUNTER — Other Ambulatory Visit: Payer: Self-pay | Admitting: Family Medicine

## 2012-02-16 ENCOUNTER — Other Ambulatory Visit: Payer: Self-pay | Admitting: Family Medicine

## 2012-03-23 ENCOUNTER — Encounter: Payer: Self-pay | Admitting: Family Medicine

## 2012-03-23 ENCOUNTER — Ambulatory Visit (INDEPENDENT_AMBULATORY_CARE_PROVIDER_SITE_OTHER): Payer: Medicare Other | Admitting: Family Medicine

## 2012-03-23 ENCOUNTER — Telehealth: Payer: Self-pay | Admitting: Family Medicine

## 2012-03-23 VITALS — BP 149/81 | HR 63 | Temp 97.7°F | Ht 62.0 in | Wt 150.5 lb

## 2012-03-23 DIAGNOSIS — R350 Frequency of micturition: Secondary | ICD-10-CM

## 2012-03-23 DIAGNOSIS — IMO0001 Reserved for inherently not codable concepts without codable children: Secondary | ICD-10-CM

## 2012-03-23 DIAGNOSIS — M25519 Pain in unspecified shoulder: Secondary | ICD-10-CM

## 2012-03-23 DIAGNOSIS — N400 Enlarged prostate without lower urinary tract symptoms: Secondary | ICD-10-CM

## 2012-03-23 DIAGNOSIS — E119 Type 2 diabetes mellitus without complications: Secondary | ICD-10-CM

## 2012-03-23 LAB — POCT URINALYSIS DIPSTICK
Bilirubin, UA: NEGATIVE
Glucose, UA: 100
Ketones, UA: NEGATIVE
Nitrite, UA: NEGATIVE

## 2012-03-23 LAB — POCT UA - MICROSCOPIC ONLY

## 2012-03-23 LAB — POCT GLYCOSYLATED HEMOGLOBIN (HGB A1C): Hemoglobin A1C: 6.6

## 2012-03-23 MED ORDER — CEPHALEXIN 500 MG PO CAPS: 500.0000 mg | ORAL_CAPSULE | Freq: Two times a day (BID) | ORAL | Status: AC

## 2012-03-23 NOTE — Assessment & Plan Note (Signed)
Worsened.  His daughter accompanies him today.  We discuss his elevated PSA and likelihood of cancer.  He is very reluctant to do anything operative for his obstruction symptoms but we both urge him for QOL reasons to see a urologist since medical therapy is maxed out.   Will check UA and treat for any infection but he will likely still need a TURP

## 2012-03-23 NOTE — Telephone Encounter (Signed)
Called and left voicemail to call us about urine results

## 2012-03-23 NOTE — Patient Instructions (Addendum)
We will call you with the referral to Urology after Jan 2   I will call you if the urine shows any infection  Call us if the urine is getting a lot worse or if you have fever or back pain  Do the wall walking exercises twice daily to three times daily and take tylenol for your shoulder pain  If your bowels are not back to normal in 2-3 weeks or if getting worse come back  Have a great New Year

## 2012-03-23 NOTE — Telephone Encounter (Signed)
Called left voicemail and message to call my cell if he gets the message tonight.

## 2012-03-23 NOTE — Progress Notes (Signed)
  Subjective:    Patient ID: EDKER PUNT, male    DOB: 09/13/1917, 78 y.o.   MRN: [id number]  HPI  Frequency Noticed a significant increase in his urinary symptoms for frequency, having to massage his bladder to empty.  Similar to symptoms before he started tamulosin and finasteride.  No change in urine or fever or dyuria or back pain.  No otc medications or change in diet.  Did have loose stools for the last few days without blood  Shoulder Pain Left Fell on left shoulder a few weeks ago.  Is painful to move.  Has slowly been getting better.  Main pain is moving over his head.  Does not think is weak.  No pain when he passively moves its.  No focal pain  Review of Symptoms - see HPI  PMH - Smoking status noted.     Review of Systems     Objective:   Physical Exam  Alert no acute distress Left Shoulder - good passive ROM.  Does have full ROM with encouragement but does cause pain posterior shoulder.  Pain increased with empty can test but has some resistance.  Area of focal soft tissue swelling over anterior upper shouder without tenderness or bruising.  No focal pain along shoulder, elbow or wrist and no deformity except as above       Assessment & Plan:
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
## 2012-04-02 ENCOUNTER — Emergency Department (HOSPITAL_COMMUNITY): Payer: Medicare Other

## 2012-04-02 ENCOUNTER — Encounter (HOSPITAL_COMMUNITY): Payer: Self-pay | Admitting: Family Medicine

## 2012-04-02 ENCOUNTER — Emergency Department (HOSPITAL_COMMUNITY)
Admission: EM | Admit: 2012-04-02 | Discharge: 2012-04-29 | DRG: 082 | Disposition: E | Payer: Medicare Other | Attending: Emergency Medicine | Admitting: Emergency Medicine

## 2012-04-02 DIAGNOSIS — Z515 Encounter for palliative care: Secondary | ICD-10-CM

## 2012-04-02 DIAGNOSIS — R972 Elevated prostate specific antigen [PSA]: Secondary | ICD-10-CM | POA: Diagnosis present

## 2012-04-02 DIAGNOSIS — X749XXA Intentional self-harm by unspecified firearm discharge, initial encounter: Secondary | ICD-10-CM

## 2012-04-02 DIAGNOSIS — S069X9A Unspecified intracranial injury with loss of consciousness of unspecified duration, initial encounter: Secondary | ICD-10-CM

## 2012-04-02 DIAGNOSIS — Z87891 Personal history of nicotine dependence: Secondary | ICD-10-CM

## 2012-04-02 DIAGNOSIS — W3400XA Accidental discharge from unspecified firearms or gun, initial encounter: Secondary | ICD-10-CM

## 2012-04-02 DIAGNOSIS — S0190XA Unspecified open wound of unspecified part of head, initial encounter: Secondary | ICD-10-CM

## 2012-04-02 DIAGNOSIS — S020XXB Fracture of vault of skull, initial encounter for open fracture: Secondary | ICD-10-CM | POA: Diagnosis present

## 2012-04-02 DIAGNOSIS — N400 Enlarged prostate without lower urinary tract symptoms: Secondary | ICD-10-CM | POA: Diagnosis present

## 2012-04-02 DIAGNOSIS — Z66 Do not resuscitate: Secondary | ICD-10-CM | POA: Diagnosis present

## 2012-04-02 DIAGNOSIS — X731XXA Intentional self-harm by hunting rifle discharge, initial encounter: Secondary | ICD-10-CM | POA: Diagnosis present

## 2012-04-02 DIAGNOSIS — E119 Type 2 diabetes mellitus without complications: Secondary | ICD-10-CM | POA: Diagnosis present

## 2012-04-02 DIAGNOSIS — Y92009 Unspecified place in unspecified non-institutional (private) residence as the place of occurrence of the external cause: Secondary | ICD-10-CM

## 2012-04-02 DIAGNOSIS — J9600 Acute respiratory failure, unspecified whether with hypoxia or hypercapnia: Secondary | ICD-10-CM | POA: Diagnosis present

## 2012-04-02 DIAGNOSIS — Z8546 Personal history of malignant neoplasm of prostate: Secondary | ICD-10-CM

## 2012-04-02 HISTORY — DX: Benign prostatic hyperplasia without lower urinary tract symptoms: N40.0

## 2012-04-02 HISTORY — DX: Type 2 diabetes mellitus without complications: E11.9

## 2012-04-02 HISTORY — DX: Unspecified abdominal hernia without obstruction or gangrene: K46.9

## 2012-04-02 LAB — COMPREHENSIVE METABOLIC PANEL
Alkaline Phosphatase: 82 U/L (ref 39–117)
BUN: 138 mg/dL — ABNORMAL HIGH (ref 6–23)
GFR calc Af Amer: 5 mL/min — ABNORMAL LOW (ref >90)
Glucose, Bld: 190 mg/dL — ABNORMAL HIGH (ref 70–99)
Potassium: 6.2 mEq/L — ABNORMAL HIGH (ref 3.5–5.1)
Total Protein: 6.1 g/dL (ref 6.0–8.3)

## 2012-04-02 LAB — CBC WITH DIFFERENTIAL/PLATELET
Eosinophils Absolute: 0.1 10*3/uL (ref 0.0–0.7)
Eosinophils Relative: 1 % (ref 0–5)
Hemoglobin: 11.6 g/dL — ABNORMAL LOW (ref 13.0–17.0)
Lymphs Abs: 1 10*3/uL (ref 0.7–4.0)
MCH: 28.1 pg (ref 26.0–34.0)
MCV: 81.4 fL (ref 78.0–100.0)
Monocytes Relative: 8 % (ref 3–12)
Neutrophils Relative %: 83 % — ABNORMAL HIGH (ref 43–77)
RBC: 4.13 MIL/uL — ABNORMAL LOW (ref 4.22–5.81)

## 2012-04-02 LAB — POCT I-STAT 3, ART BLOOD GAS (G3+)
Bicarbonate: 13 mEq/L — ABNORMAL LOW (ref 20.0–24.0)
TCO2: 14 mmol/L (ref 0–100)
pCO2 arterial: 43.4 mmHg (ref 35.0–45.0)
pH, Arterial: 7.083 — CL (ref 7.350–7.450)

## 2012-04-02 LAB — POCT I-STAT, CHEM 8
BUN: >140 mg/dL — ABNORMAL HIGH (ref 6–23)
Chloride: 100 mEq/L (ref 96–112)
Potassium: 6 mEq/L — ABNORMAL HIGH (ref 3.5–5.1)
Sodium: 124 mEq/L — ABNORMAL LOW (ref 135–145)

## 2012-04-02 LAB — ABO/RH: ABO/RH(D): O POS

## 2012-04-02 LAB — PROTIME-INR: Prothrombin Time: 25.2 seconds — ABNORMAL HIGH (ref 11.6–15.2)

## 2012-04-02 MED ORDER — ETOMIDATE 2 MG/ML IV SOLN
INTRAVENOUS | Status: AC
  Filled 2012-04-02: qty 20

## 2012-04-02 MED ORDER — LIDOCAINE HCL (CARDIAC) 20 MG/ML IV SOLN
INTRAVENOUS | Status: AC
  Filled 2012-04-02: qty 5

## 2012-04-02 MED ORDER — SUCCINYLCHOLINE CHLORIDE 20 MG/ML IJ SOLN
INTRAMUSCULAR | Status: AC | PRN
  Administered 2012-04-02: 100 mg via INTRAVENOUS

## 2012-04-02 MED ORDER — ETOMIDATE 2 MG/ML IV SOLN
INTRAVENOUS | Status: AC | PRN
  Administered 2012-04-02: 30 mg via INTRAVENOUS

## 2012-04-02 MED ORDER — SUCCINYLCHOLINE CHLORIDE 20 MG/ML IJ SOLN
INTRAMUSCULAR | Status: AC
  Filled 2012-04-02: qty 1

## 2012-04-02 MED ORDER — ROCURONIUM BROMIDE 50 MG/5ML IV SOLN
INTRAVENOUS | Status: AC
  Filled 2012-04-02: qty 2

## 2012-04-03 ENCOUNTER — Encounter: Payer: Self-pay | Admitting: Family Medicine

## 2012-04-03 LAB — TYPE AND SCREEN
Antibody Screen: NEGATIVE
Unit division: 0

## 2012-04-04 ENCOUNTER — Encounter: Payer: Self-pay | Admitting: Family Medicine
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
## 2012-04-29 NOTE — ED Notes (Signed)
Family updated as to patient's status.

## 2012-04-29 NOTE — ED Notes (Signed)
MD spoke with family and they agreed to no extreme life measures. Pt on comfort care. Pt HR and BP dropping.
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
## 2012-04-29 NOTE — ED Notes (Signed)
Patient to morgue.
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
## 2012-04-29 NOTE — ED Notes (Signed)
Family at beside. Family given emotional support. 
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
## 2012-04-29 DEATH — deceased
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
